# Patient Record
Sex: Female | Born: 1995 | Race: White | Hispanic: No | Marital: Single | Smoking: Never smoker
Health system: Southern US, Community
[De-identification: ages and names within clinical notes are randomized; demographics above are authoritative.]

---

## 2017-12-10 ENCOUNTER — Emergency Department (INDEPENDENT_AMBULATORY_CARE_PROVIDER_SITE_OTHER)
Admission: EM | Admit: 2017-12-10 | Discharge: 2017-12-10 | Disposition: A | Discharge status: Home / Self Care | Payer: Medicaid Other | Source: Home / Self Care | Attending: Family Medicine | Admitting: Family Medicine

## 2017-12-10 ENCOUNTER — Other Ambulatory Visit: Payer: Self-pay

## 2017-12-10 ENCOUNTER — Emergency Department (INDEPENDENT_AMBULATORY_CARE_PROVIDER_SITE_OTHER): Payer: Medicaid Other

## 2017-12-10 DIAGNOSIS — M546 Pain in thoracic spine: Secondary | ICD-10-CM | POA: Diagnosis not present

## 2017-12-10 DIAGNOSIS — S29012A Strain of muscle and tendon of back wall of thorax, initial encounter: Secondary | ICD-10-CM | POA: Diagnosis not present

## 2017-12-10 MED ORDER — CYCLOBENZAPRINE HCL 10 MG PO TABS: 10.0000 mg | ORAL_TABLET | Freq: Every day | ORAL | 1 refills | Status: DC
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
## 2017-12-10 NOTE — Discharge Instructions (Addendum)
Apply ice pack for 20 to 30 minutes, 3 to 4 times daily  Continue until pain and swelling decrease.  May take Tylenol for pain.  Begin range of motion and stretching exercises as tolerated. °
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
## 2021-09-04 ENCOUNTER — Emergency Department (INDEPENDENT_AMBULATORY_CARE_PROVIDER_SITE_OTHER): Payer: 59

## 2021-09-04 ENCOUNTER — Emergency Department
Admission: EM | Admit: 2021-09-04 | Discharge: 2021-09-04 | Disposition: A | Discharge status: Home / Self Care | Payer: 59 | Attending: Family Medicine | Admitting: Family Medicine

## 2021-09-04 DIAGNOSIS — R110 Nausea: Secondary | ICD-10-CM

## 2021-09-04 DIAGNOSIS — R1031 Right lower quadrant pain: Secondary | ICD-10-CM | POA: Diagnosis not present

## 2021-09-04 LAB — POCT URINALYSIS DIP (MANUAL ENTRY)
Bilirubin, UA: NEGATIVE
Blood, UA: NEGATIVE
Glucose, UA: NEGATIVE mg/dL
Ketones, POC UA: NEGATIVE mg/dL
Leukocytes, UA: NEGATIVE
Nitrite, UA: NEGATIVE
Protein Ur, POC: NEGATIVE mg/dL
Spec Grav, UA: >=1.03 — AB (ref 1.010–1.025)
Urobilinogen, UA: 0.2 E.U./dL
pH, UA: 6 (ref 5.0–8.0)

## 2021-09-04 LAB — POCT URINE PREGNANCY: Preg Test, Ur: NEGATIVE

## 2021-09-04 NOTE — ED Triage Notes (Signed)
Pt presents with c/o RLQ pain x 1 week.

## 2021-09-04 NOTE — ED Provider Notes (Signed)
[name]    CSN: [id number] Arrival date & time: 09/04/21  1743      History   Chief Complaint No chief complaint on file.   HPI Pamela Boone is a 26 y.o. female.   HPI  Healthy 26 year old.  There is on oral birth control pills.  She is here for right lower quadrant abdominal pain.  She states its been progressively worsening over a week.  Today its been worse.  She has had some waves of nausea.  No vomiting.  Some decreased appetite.  No fever or chills.  No change in bladder.  No vaginal discharge.  No change in bowels.  She states she has been on the oral contraceptives for a while.  Has never had problems with uterus or ovaries.  Is in a steady relationship.  Has never had an abdominal surgery.  She states she does have acid reflux but is not on medications.  No history of colon disorder, IBS, colitis.  She states that her last bowel movement was earlier today.  Her stool has been more soft than usual, some mucus in the stool.  She has had some darker colored stool but no blood or black.  Has not used Pepto-Bismol.  No change in diet  History reviewed. No pertinent past medical history.  There are no problems to display for this patient.   History reviewed. No pertinent surgical history.  OB History   No obstetric history on file.      Home Medications    Prior to Admission medications   Not on File    Family History History reviewed. No pertinent family history.  Social History Social History   Tobacco Use   Smoking status: Never   Smokeless tobacco: Never  Substance Use Topics   Alcohol use: Yes   Drug use: Not Currently     Allergies   Patient has no known allergies.   Review of Systems Review of Systems  See HPI Physical Exam Triage Vital Signs ED Triage Vitals  Enc Vitals Group     BP 09/04/21 1755 123/84     Pulse Rate 09/04/21 1755 (!) 110     Resp 09/04/21 1755 16     Temp 09/04/21 1755 99 F (37.2 C)     Temp  Source 09/04/21 1755 Oral     SpO2 09/04/21 1755 100 %     Weight --      Height --      Head Circumference --      Peak Flow --      Pain Score 09/04/21 1753 6     Pain Loc --      Pain Edu? --      Excl. in GC? --    No data found.  Updated Vital Signs BP 123/84 (BP Location: Right Arm)   Pulse (!) 110   Temp 99 F (37.2 C) (Oral)   Resp 16   SpO2 100%       Physical Exam Constitutional:      General: She is not in acute distress.    Appearance: She is well-developed. She is not ill-appearing.  HENT:     Head: Normocephalic and atraumatic.     Right Ear: Tympanic membrane and ear canal normal.     Left Ear: Tympanic membrane and ear canal normal.     Nose: Nose normal.     Mouth/Throat:     Mouth: Mucous membranes are moist.  Eyes:  Conjunctiva/sclera: Conjunctivae normal.     Pupils: Pupils are equal, round, and reactive to light.  Cardiovascular:     Rate and Rhythm: Normal rate and regular rhythm.     Heart sounds: Normal heart sounds.  Pulmonary:     Effort: Pulmonary effort is normal. No respiratory distress.     Breath sounds: Normal breath sounds.  Chest:     Chest wall: No tenderness.  Abdominal:     General: Bowel sounds are normal. There is no distension.     Palpations: Abdomen is soft. There is no mass.     Tenderness: There is no abdominal tenderness.     Comments: Abdomen is soft and benign.  Patient has mild tenderness with deep palpation in the right lower quadrant, no guarding or rebound.  No palpable mass  Musculoskeletal:        General: Normal range of motion.     Cervical back: Normal range of motion.  Lymphadenopathy:     Cervical: No cervical adenopathy.  Skin:    General: Skin is warm and dry.  Neurological:     Mental Status: She is alert.  Psychiatric:        Mood and Affect: Mood normal.        Behavior: Behavior normal.      UC Treatments / Results  Labs (all labs ordered are listed, but only abnormal results are  displayed) Labs Reviewed  POCT URINALYSIS DIP (MANUAL ENTRY) - Abnormal; Notable for the following components:      Result Value   Spec Grav, UA >=1.030 (*)    All other components within normal limits  POCT URINE PREGNANCY    EKG   Radiology DG Abdomen 1 View  Result Date: 09/04/2021 CLINICAL DATA:  Right lower quadrant abdominal pain and nausea over the last week EXAM: ABDOMEN - 1 VIEW COMPARISON:  None FINDINGS: The bowel gas pattern is normal. No radio-opaque calculi or other significant radiographic abnormality are seen. IMPRESSION: Normal abdominal radiographs. Electronically Signed   By: Paulina Fusi M.D.   On: 09/04/2021 18:34    Procedures Procedures (including critical care time)  Medications Ordered in UC Medications - No data to display  Initial Impression / Assessment and Plan / UC Course  I have reviewed the triage vital signs and the nursing notes.  Pertinent labs & imaging results that were available during my care of the patient were reviewed by me and considered in my medical decision making (see chart for details).     Patient still has a lot of stool in her right colon even though she states she is having regular bowel movements.  I recommend MiraLAX and probiotics.  I do not think she has appendicitis, surgical abdomen, ovarian torsion or other serious right lower quadrant pathology.  Discussed with patient and significant other Final Clinical Impressions(s) / UC Diagnoses   Final diagnoses:  Right lower quadrant abdominal pain     Discharge Instructions      Drink lots of water Take MiraLAX daily.  This is a medication that will help promote a bowel movement Take a probiotic daily Call your doctor for follow-up appointment if not improving by the end of the week       ED Prescriptions   None    PDMP not reviewed this encounter.   Eustace Moore, MD 09/04/21 [phone]

## 2021-09-04 NOTE — Discharge Instructions (Signed)
Drink lots of water Take MiraLAX daily.  This is a medication that will help promote a bowel movement Take a probiotic daily Call your doctor for follow-up appointment if not improving by the end of the week
